# Patient Record
Sex: Male | Born: 1990 | Race: Black or African American | Hispanic: No | Marital: Single | Smoking: Never smoker
Health system: Southern US, Community
[De-identification: ages and names within clinical notes are randomized; demographics above are authoritative.]

---

## 2015-10-11 ENCOUNTER — Emergency Department (HOSPITAL_COMMUNITY)
Admission: EM | Admit: 2015-10-11 | Discharge: 2015-10-11 | Disposition: A | Discharge status: Home / Self Care | Payer: Commercial Managed Care - PPO | Attending: Emergency Medicine | Admitting: Emergency Medicine

## 2015-10-11 ENCOUNTER — Emergency Department (HOSPITAL_COMMUNITY): Payer: Commercial Managed Care - PPO

## 2015-10-11 ENCOUNTER — Encounter (HOSPITAL_COMMUNITY): Payer: Self-pay | Admitting: Emergency Medicine

## 2015-10-11 DIAGNOSIS — S3992XA Unspecified injury of lower back, initial encounter: Secondary | ICD-10-CM | POA: Diagnosis not present

## 2015-10-11 DIAGNOSIS — Y9289 Other specified places as the place of occurrence of the external cause: Secondary | ICD-10-CM | POA: Insufficient documentation

## 2015-10-11 DIAGNOSIS — Y9389 Activity, other specified: Secondary | ICD-10-CM | POA: Diagnosis not present

## 2015-10-11 DIAGNOSIS — Y998 Other external cause status: Secondary | ICD-10-CM | POA: Diagnosis not present

## 2015-10-11 DIAGNOSIS — M25562 Pain in left knee: Secondary | ICD-10-CM

## 2015-10-11 DIAGNOSIS — S29001A Unspecified injury of muscle and tendon of front wall of thorax, initial encounter: Secondary | ICD-10-CM | POA: Diagnosis not present

## 2015-10-11 DIAGNOSIS — S8992XA Unspecified injury of left lower leg, initial encounter: Secondary | ICD-10-CM | POA: Diagnosis not present

## 2015-10-11 DIAGNOSIS — M549 Dorsalgia, unspecified: Secondary | ICD-10-CM

## 2015-10-11 MED ORDER — IBUPROFEN 800 MG PO TABS
800.0000 mg | ORAL_TABLET | Freq: Once | ORAL | Status: AC
  Administered 2015-10-11: 800 mg via ORAL
  Filled 2015-10-11: qty 1

## 2015-10-11 MED ORDER — IBUPROFEN 600 MG PO TABS: 600.0000 mg | ORAL_TABLET | Freq: Four times a day (QID) | ORAL | Status: AC | PRN

## 2015-10-11 NOTE — Discharge Instructions (Signed)
1. Medications: motrin, usual home medications 2. Treatment: rest, drink plenty of fluids, ice, elevate 3. Follow Up: please followup with your primary doctor for discussion of your diagnoses and further evaluation after today's visit; if you do not have a primary care doctor use the resource guide provided to find one; please return to the ER for severe pain, numbness, weakness, new or worsening symptoms   Back Pain, Adult Back pain is very common in adults.The cause of back pain is rarely dangerous and the pain often gets better over time.The cause of your back pain may not be known. Some common causes of back pain include:  Strain of the muscles or ligaments supporting the spine.  Wear and tear (degeneration) of the spinal disks.  Arthritis.  Direct injury to the back. For many people, back pain may return. Since back pain is rarely dangerous, most people can learn to manage this condition on their own. HOME CARE INSTRUCTIONS Watch your back pain for any changes. The following actions may help to lessen any discomfort you are feeling:  Remain active. It is stressful on your back to sit or stand in one place for long periods of time. Do not sit, drive, or stand in one place for more than 30 minutes at a time. Take short walks on even surfaces as soon as you are able.Try to increase the length of time you walk each day.  Exercise regularly as directed by your health care provider. Exercise helps your back heal faster. It also helps avoid future injury by keeping your muscles strong and flexible.  Do not stay in bed.Resting more than 1-2 days can delay your recovery.  Pay attention to your body when you bend and lift. The most comfortable positions are those that put less stress on your recovering back. Always use proper lifting techniques, including:  Bending your knees.  Keeping the load close to your body.  Avoiding twisting.  Find a comfortable position to sleep. Use a firm  mattress and lie on your side with your knees slightly bent. If you lie on your back, put a pillow under your knees.  Avoid feeling anxious or stressed.Stress increases muscle tension and can worsen back pain.It is important to recognize when you are anxious or stressed and learn ways to manage it, such as with exercise.  Take medicines only as directed by your health care provider. Over-the-counter medicines to reduce pain and inflammation are often the most helpful.Your health care provider may prescribe muscle relaxant drugs.These medicines help dull your pain so you can more quickly return to your normal activities and healthy exercise.  Apply ice to the injured area:  Put ice in a plastic bag.  Place a towel between your skin and the bag.  Leave the ice on for 20 minutes, 2-3 times a day for the first 2-3 days. After that, ice and heat may be alternated to reduce pain and spasms.  Maintain a healthy weight. Excess weight puts extra stress on your back and makes it difficult to maintain good posture. SEEK MEDICAL CARE IF:  You have pain that is not relieved with rest or medicine.  You have increasing pain going down into the legs or buttocks.  You have pain that does not improve in one week.  You have night pain.  You lose weight.  You have a fever or chills. SEEK IMMEDIATE MEDICAL CARE IF:   You develop new bowel or bladder control problems.  You have unusual weakness or numbness  in your arms or legs.  You develop nausea or vomiting.  You develop abdominal pain.  You feel faint.   This information is not intended to replace advice given to you by your health care provider. Make sure you discuss any questions you have with your health care provider.   Document Released: 10/26/2005 Document Revised: 11/16/2014 Document Reviewed: 02/27/2014 Elsevier Interactive Patient Education 2016 Elsevier Inc.  Knee Pain Knee pain is a very common symptom and can have many  causes. Knee pain often goes away when you follow your health care provider's instructions for relieving pain and discomfort at home. However, knee pain can develop into a condition that needs treatment. Some conditions may include:  Arthritis caused by wear and tear (osteoarthritis).  Arthritis caused by swelling and irritation (rheumatoid arthritis or gout).  A cyst or growth in your knee.  An infection in your knee joint.  An injury that will not heal.  Damage, swelling, or irritation of the tissues that support your knee (torn ligaments or tendinitis). If your knee pain continues, additional tests may be ordered to diagnose your condition. Tests may include X-rays or other imaging studies of your knee. You may also need to have fluid removed from your knee. Treatment for ongoing knee pain depends on the cause, but treatment may include:  Medicines to relieve pain or swelling.  Steroid injections in your knee.  Physical therapy.  Surgery. HOME CARE INSTRUCTIONS  Take medicines only as directed by your health care provider.  Rest your knee and keep it raised (elevated) while you are resting.  Do not do things that cause or worsen pain.  Avoid high-impact activities or exercises, such as running, jumping rope, or doing jumping jacks.  Apply ice to the knee area:  Put ice in a plastic bag.  Place a towel between your skin and the bag.  Leave the ice on for 20 minutes, 2-3 times a day.  Ask your health care provider if you should wear an elastic knee support.  Keep a pillow under your knee when you sleep.  Lose weight if you are overweight. Extra weight can put pressure on your knee.  Do not use any tobacco products, including cigarettes, chewing tobacco, or electronic cigarettes. If you need help quitting, ask your health care provider. Smoking may slow the healing of any bone and joint problems that you may have. SEEK MEDICAL CARE IF:  Your knee pain continues,  changes, or gets worse.  You have a fever along with knee pain.  Your knee buckles or locks up.  Your knee becomes more swollen. SEEK IMMEDIATE MEDICAL CARE IF:   Your knee joint feels hot to the touch.  You have chest pain or trouble breathing.   This information is not intended to replace advice given to you by your health care provider. Make sure you discuss any questions you have with your health care provider.   Document Released: 08/23/2007 Document Revised: 11/16/2014 Document Reviewed: 06/11/2014 Elsevier Interactive Patient Education 2016 [employer].   Emergency Department Resource Guide 1) Find a Doctor and Pay Out of Pocket Although you won't have to find out who is covered by your insurance plan, it is a good idea to ask around and get recommendations. You will then need to call the office and see if the doctor you have chosen will accept you as a new patient and what types of options they offer for patients who are self-pay. Some doctors offer discounts or will set  up payment plans for their patients who do not have insurance, but you will need to ask so you aren't surprised when you get to your appointment.  2) Contact Your Local Health Department Not all health departments have doctors that can see patients for sick visits, but many do, so it is worth a call to see if yours does. If you don't know where your local health department is, you can check in your phone book. The CDC also has a tool to help you locate your state's health department, and many state websites also have listings of all of their local health departments.  3) Find a Walk-in Clinic If your illness is not likely to be very severe or complicated, you may want to try a walk in clinic. These are popping up all over the country in pharmacies, drugstores, and shopping centers. They're usually staffed by nurse practitioners or physician assistants that have been trained to treat common illnesses and  complaints. They're usually fairly quick and inexpensive. However, if you have serious medical issues or chronic medical problems, these are probably not your best option.  No Primary Care Doctor: - Call Health Connect at  [phone] - they can help you locate a primary care doctor that  accepts your insurance, provides certain services, etc. - Physician Referral Service- [phone]  Chronic Pain Problems: Organization         Address  Phone   Notes  [facility]  [phone] Patients need to be referred by their primary care doctor.   Medication Assistance: Organization         Address  Phone   Notes  [facility] [address] [place], [place] [address] [phone] --Must be a resident of [facility] -- Must have NO insurance coverage whatsoever (no Medicaid/ Medicare, etc.) -- The pt. MUST have a primary care doctor that directs their care regularly and follows them in the community   Med[facility]ist  [phone]   [employer]  [phone]    Agencies that provide inexpensive medical care: Organization         Address  Phone   Notes  [facility]  [phone]   [facility]    [phone]) [phone]   [facility] [address] [place], [place] [address] [phone]   [facility] [address], [place] [phone]) [phone]   Planned Parenthood    [phone]   [facility]    [phone]   [facility] and [facility]  [address], [place] Phone:  [phone], Fax:  [phone] Hours of Operation:  9 am - 6 pm, M-F.  Also accepts Medicaid/Medicare and self-pay.  [facility]  [address], [place] Phone: [phone], Fax: [phone]) [phone]. Hours of Operation:  8:30 am - 5:30 pm, M-F.  Also accepts Medicaid and self-pay.  [facility] [place] [address], [place] Phone: [phone]   [facility] [address], [place], [place] [phone], Ext. [phone] Mondays & Thursdays: 7-9 AM.  First 15 patients are seen on a first come, first serve basis.    Medicaid-accepting [facility] Providers:  Organization         Address  Phone   Notes  [facility] [address], [place] [phone]) [phone] Also  accepts self-pay patients.  [facility] [address] Laurell Josephs [place], [place]  [phone]   [facility] [address], [place] [phone]   [facility] [address] [place] [phone]   Renaye Rakers [address], [place]   [phone] Only accepts [place] Access [place] patients after they have their name applied to their card.   Self-Pay (no insurance) in [facility]:  Organization         Address  Phone   Notes  Sickle Cell Patients, [facility] [address] [place], [place] [phone]   [facility] [address] [place], [place] [phone]   [facility]  [address], [place] [phone]   [facility]/Dr. Osei-Bonsu  [address], [place] or [address], [place] [phone] Phone number for both [place] and [place] locations is the same.  [facility]gent [facility] and [facility] [address], [place] [phone]   [facility] [address] [place] or [address] [phone]   [facility] [address] [place] [phone], phone; [phone], fax Sees patients 1st and 3rd Saturday of every month.  Must not qualify for public or private insurance (i.e. Medicaid, Medicare, [place] Health Choice, Veterans' Benefits)  Household income should be no more than 200% of the poverty level  The clinic cannot treat you if you are pregnant or think you are pregnant  Sexually transmitted diseases are not treated at the clinic.    Dental Care: Organization         Address  Phone  Notes  [facility] of [facility] [address] [place], [place] [phone] Accepts children up to age 69 who are enrolled in [place] or [place] Health Choice; pregnant women with a Medicaid card; and children who have applied for Medicaid or [place] Health Choice, but were declined, whose parents can pay a reduced fee at time of service.  [facility] Department of [facility]  [address], [place] [phone] Accepts children up to age 40 who are enrolled in [place] or [place] Health Choice; pregnant women with a Medicaid card; and children who have applied for Medicaid or [place] Health Choice, but were declined, whose parents can pay a reduced fee at time of service.  [facility]  [address] [place], [place] [phone] Patients are seen by appointment only. Walk-ins are not accepted. [facility] will see patients 54 years of age and older. Monday - Tuesday (8am-5pm) Most Wednesdays (8:30-5pm) $30 per visit, cash only  [facility] Dental Access PROGRAM  [address], [facility] [phone] Patients are seen by appointment only. Walk-ins are not accepted. [facility] will see patients 71 years of age and older. One Wednesday Evening (Monthly: Volunteer Based).  $30 per visit, cash only  [employer]  [phone] for adults; Children under age 30, call Graduate Pediatric Dentistry at [phone]. Children aged 32-14, please call [phone] to request a pediatric application.  Dental services are provided in all areas of dental care including fillings, crowns and bridges, complete and partial dentures, implants, gum treatment, root canals, and extractions. Preventive care is  also provided. Treatment is  provided to both adults and children. Patients are selected via a lottery and there is often a waiting list.   [facility] [address] [place]  [phone] [email or web address]   [facility] [address] [place], [place] [phone], Ext. [phone] Second and Fourth Thursday of each month, opens at 6:30 AM; Clinic ends at 9 AM.  Patients are seen on a first-come first-served basis, and a limited number are seen during each clinic.   [facility]  [address] Ether Griffins [place], [place] [phone]   Eligibility Requirements You must have lived in [place], [place], or [place] counties for at least the last three months.   You cannot be eligible for state or federal sponsored [place], including [employer], [place], or [employer].   You generally cannot be eligible for healthcare insurance through your employer.    How to apply: Eligibility screenings are held every Tuesday and Wednesday afternoon from 1:00 pm until 4:00 pm. You do not need an appointment for the interview!  [facility] [address], [place], [place] [phone]   [facility] Health Department  [phone]   [facility]  [phone]   [facility] Health Department  [phone]    Behavioral Health Resources in the Community: Intensive Outpatient Programs Organization         Address  Phone  Notes  [facility] [address], [place], [place] [phone]   [facility] [address] [place], [place] [phone]   [facility] [address] [place], [place]  [phone]   [facility] Mental Health [address]  [place], [place] [phone] or 2036511805   Substance Abuse Resources Organization         Address  Phone  Notes  Al[facility] Drug Services  [phone]   [facility]  [phone]   [facility] [place]  [phone]   Floydene Flock  [phone]   Residential & Outpatient Substance Abuse Program  [phone]   Psychological Services Organization         Address  Phone  Notes  [facility]  [phone]   [facility]  [phone])[phone]   [facility] Mental Health [address], [place] [phone] or [phone]    Mobile Crisis Teams Organization         Address  Phone  Notes  [facility], Mobile Crisis Care Unit  [phone]   [facility]  [address] [place], [place] [phone]   Doristine Locks [address] [place] [phone]    Self-Help/Support Groups Organization         Address  Phone             Notes  [facility] [place] - variety of support groups  [phone]- I7437963 Call for more information  Narcotics Anonymous (NA), [facility] [address], [employer] [place]  2 meetings at this location   Statistician         Address  Phone  Notes  [facility] [address],    [place]  [phone]   [facility]  [address] [place] [address], [place], [place] [phone]   [facility] [address] [place], [place] [phone] Admissions: 8am-3pm M-F  [facility] [address]    [place], [place] [phone]  [facility] [address] Starling Manns [place], [place] [phone]   [facility] [address] [place] [address]  [place], [place] [phone]   [facility] - Intensive Outpatient [address]ure[address], [place], [place] [phone]   [facility] [address] [place]  [place], [place] [phone] or [phone]   [facility] ([facility]) [address], [place], [place] [phone] Accepts Medicaid  [facility] [address],  [place] [phone]  Substance Abuse/Addiction Treatment   [facility] Organization         Address  Phone  Notes  [facility]  [phone]   Angie Fava, PhD [address] Ervin Knack [place], [place]   ([phone] or [phone]   [facility]   [address] [place], [place] [phone]   [facility] [address] [place], [place] [phone] Insurance/Medicaid/sponsorship through [facility] and Families [address]                                    [place], [place] [phone] Therapy/tele-psych/case  [facility] [address], [place] [phone]    Dr. Lolly Mustache  [phone]   [facility]  [employer] [facility] 1) [address] [place] 2) [address], [place] 3)  [address], [place] [phone]  [phone]   [facility] Child Abuse Hotline (859)003-1758 or [phone] (After Hours)
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
